# Patient Record
Sex: Female | Born: 2000 | Race: Black or African American | Hispanic: No | Marital: Single | Smoking: Never smoker
Health system: Southern US, Community
[De-identification: ages and names within clinical notes are randomized; demographics above are authoritative.]

---

## 2019-12-24 ENCOUNTER — Emergency Department (HOSPITAL_COMMUNITY): Payer: BC Managed Care – PPO

## 2019-12-24 ENCOUNTER — Emergency Department (HOSPITAL_COMMUNITY)
Admission: EM | Admit: 2019-12-24 | Discharge: 2019-12-24 | Disposition: A | Discharge status: Home / Self Care | Payer: BC Managed Care – PPO | Attending: Emergency Medicine | Admitting: Emergency Medicine

## 2019-12-24 ENCOUNTER — Encounter (HOSPITAL_COMMUNITY): Payer: Self-pay | Admitting: Emergency Medicine

## 2019-12-24 ENCOUNTER — Other Ambulatory Visit: Payer: Self-pay

## 2019-12-24 DIAGNOSIS — R0602 Shortness of breath: Secondary | ICD-10-CM

## 2019-12-24 DIAGNOSIS — U071 COVID-19: Secondary | ICD-10-CM | POA: Diagnosis not present

## 2019-12-24 DIAGNOSIS — R000 Tachycardia, unspecified: Secondary | ICD-10-CM | POA: Diagnosis not present

## 2019-12-24 DIAGNOSIS — R0789 Other chest pain: Secondary | ICD-10-CM | POA: Insufficient documentation

## 2019-12-24 MED ORDER — PREDNISONE 50 MG PO TABS: 50.0000 mg | ORAL_TABLET | Freq: Every day | ORAL | 0 refills | Status: AC

## 2019-12-24 MED ORDER — ALBUTEROL SULFATE HFA 108 (90 BASE) MCG/ACT IN AERS
2.0000 | INHALATION_SPRAY | Freq: Once | RESPIRATORY_TRACT | Status: AC
  Administered 2019-12-24: 2 via RESPIRATORY_TRACT
  Filled 2019-12-24: qty 6.7

## 2019-12-24 NOTE — ED Notes (Signed)
Patient declined repeat COVID-19 testing. MD aware.

## 2019-12-24 NOTE — ED Provider Notes (Signed)
[facility]-EMERGENCY DEPT Provider Note   CSN: [id number] Arrival date & time: 12/24/19  1514     History No chief complaint on file.   Danielle Graves is a 19 y.o. female. She has no significant past medical history. She started with Covid symptoms on November 11 and tested positive on the 16th. She said most of her symptoms are resolved but she still continues to feel short of breath and tight in her chest. No cough no fever. No abdominal pain. She is a non-smoker and does not have any prior pulmonary history. She did not receive any treatment for her Covid infection. She is not vaccinated.  The history is provided by the patient.  Shortness of Breath Severity:  Moderate Onset quality:  Gradual Timing:  Constant Progression:  Unchanged Chronicity:  New Relieved by:  None tried Worsened by:  Nothing Ineffective treatments:  None tried Associated symptoms: chest pain (tightness)   Associated symptoms: no abdominal pain, no cough, no fever, no headaches, no hemoptysis, no rash, no sore throat and no vomiting        History reviewed. No pertinent past medical history.  There are no problems to display for this patient.   History reviewed. No pertinent surgical history.   OB History   No obstetric history on file.     History reviewed. No pertinent family history.  Social History   Tobacco Use  . Smoking status: Never Smoker  . Smokeless tobacco: Never Used  Vaping Use  . Vaping Use: Never used  Substance Use Topics  . Alcohol use: Not Currently  . Drug use: Not Currently    Home Medications Prior to Admission medications   Medication Sig Start Date End Date Taking? Authorizing Provider  predniSONE (DELTASONE) 50 MG tablet Take 1 tablet (50 mg total) by mouth daily. 12/24/19   Terrilee Files, MD    Allergies    Patient has no allergy information on record.  Review of Systems   Review of Systems  Constitutional: Negative for  fever.  HENT: Negative for sore throat.   Eyes: Negative for visual disturbance.  Respiratory: Positive for shortness of breath. Negative for cough and hemoptysis.   Cardiovascular: Positive for chest pain (tightness).  Gastrointestinal: Negative for abdominal pain and vomiting.  Genitourinary: Negative for dysuria.  Musculoskeletal: Negative for back pain.  Skin: Negative for rash.  Neurological: Negative for headaches.    Physical Exam Updated Vital Signs BP 99/79 (BP Location: Right Arm)   Pulse 99   Temp 98.9 F (37.2 C) (Oral)   Resp 18   Ht 5\' 10"  (1.778 m)   Wt 59 kg   LMP 12/11/2019   SpO2 98%   BMI 18.65 kg/m   Physical Exam Vitals and nursing note reviewed.  Constitutional:      General: She is not in acute distress.    Appearance: Normal appearance. She is well-developed.  HENT:     Head: Normocephalic and atraumatic.  Eyes:     Conjunctiva/sclera: Conjunctivae normal.  Cardiovascular:     Rate and Rhythm: Regular rhythm. Tachycardia present.     Pulses: Normal pulses.     Heart sounds: No murmur heard.   Pulmonary:     Effort: Pulmonary effort is normal. No respiratory distress.     Breath sounds: Normal breath sounds.  Abdominal:     Palpations: Abdomen is soft.     Tenderness: There is no abdominal tenderness.  Musculoskeletal:  General: Normal range of motion.     Cervical back: Neck supple.     Right lower leg: No edema.     Left lower leg: No edema.  Skin:    General: Skin is warm and dry.     Capillary Refill: Capillary refill takes less than 2 seconds.  Neurological:     General: No focal deficit present.     Mental Status: She is alert.     ED Results / Procedures / Treatments   Labs (all labs ordered are listed, but only abnormal results are displayed) Labs Reviewed - No data to display  EKG None  Radiology DG Chest [facility] 1 View  Result Date: 12/24/2019 CLINICAL DATA:  Shortness of breath, diagnosed with COVID-19 on  11/18 EXAM: PORTABLE CHEST 1 VIEW COMPARISON:  None. FINDINGS: The heart size and mediastinal contours are within normal limits. Both lungs are clear. No pleural effusion. No pneumothorax. The visualized skeletal structures are unremarkable. IMPRESSION: No acute process in the chest. Electronically Signed   By: Guadlupe Spanish M.D.   On: 12/24/2019 16:26    Procedures Procedures (including critical care time)  Medications Ordered in ED Medications - No data to display  ED Course  I have reviewed the triage vital signs and the nursing notes.  Pertinent labs & imaging results that were available during my care of the patient were reviewed by me and considered in my medical decision making (see chart for details).  Clinical Course as of Dec 25 911  Tue Dec 24, 2019  1532 Patient tested Covid positive on 11/16   [MB]  9191 19 year old female Covid positive here with complaint of chest tightness and shortness of breath over the last few days.  She is in no respiratory distress with a normal respiratory rate and pulse ox 99% on room air.  She is tachycardic.   [MB]  1611 Chest x-ray interpreted by me as no acute pulmonary disease.   [MB]    Clinical Course User Index [MB] Terrilee Files, MD   MDM Rules/Calculators/A&P                         Marikay Roads was evaluated in Emergency Department on 12/24/2019 for the symptoms described in the history of present illness. She was evaluated in the context of the global COVID-19 pandemic, which necessitated consideration that the patient might be at risk for infection with the SARS-CoV-2 virus that causes COVID-19. Institutional protocols and algorithms that pertain to the evaluation of patients at risk for COVID-19 are in a state of rapid change based on information released by regulatory bodies including the CDC and federal and state organizations. These policies and algorithms were followed during the patient's care in the  ED.  Differential diagnosis includes Covid infection, pneumonia, pneumothorax, PE.  Albuterol MDI with some improvement in her shortness of breath.  Do not hear any wheezing but will treat her with inhaler and steroids.  Do not feel she needs CT imaging for PE has appears very comfortable and not having any pain. Final Clinical Impression(s) / ED Diagnoses Final diagnoses:  COVID-19 virus infection  SOB (shortness of breath)    Rx / DC Orders ED Discharge Orders         Ordered    predniSONE (DELTASONE) 50 MG tablet  Daily        12/24/19 1649           Terrilee Files,  MD 12/25/19 4680

## 2019-12-24 NOTE — ED Triage Notes (Signed)
Patient was dx w/ COVID-19 on 11/18, has had chest tightness and SOB x4 days. States she had a cough and fever earlier in her illness, but does not have that currently.

## 2019-12-24 NOTE — Discharge Instructions (Signed)
You are seen in the emergency department for evaluation of continued shortness of breath in the setting of recent Covid diagnosis.  Your oxygen level was good and your chest x-ray did not show any signs of pneumonia.  Please use albuterol 2 puffs every 4 hours as needed for shortness of breath.  Drink plenty of fluids.  Finish steroids.  Follow-up in the post Covid clinic.  Return to the emergency department for any worsening or concerning symptoms.

## 2019-12-24 NOTE — ED Notes (Signed)
Ambulated in room w/ pulse ox, oxygen stayed 98-100% RA.

## 2019-12-25 ENCOUNTER — Encounter (HOSPITAL_COMMUNITY): Payer: Self-pay | Admitting: Emergency Medicine

## 2020-01-01 ENCOUNTER — Telehealth: Payer: Self-pay

## 2020-01-01 NOTE — Telephone Encounter (Signed)
Pt was called per referral from [facility] to make appt w/ [facility]. lvm to return call

## 2020-07-31 ENCOUNTER — Encounter (HOSPITAL_COMMUNITY): Payer: Self-pay

## 2020-07-31 ENCOUNTER — Other Ambulatory Visit: Payer: Self-pay

## 2020-07-31 ENCOUNTER — Emergency Department (HOSPITAL_COMMUNITY)
Admission: EM | Admit: 2020-07-31 | Discharge: 2020-07-31 | Disposition: A | Discharge status: Home / Self Care | Payer: BC Managed Care – PPO | Attending: Emergency Medicine | Admitting: Emergency Medicine

## 2020-07-31 DIAGNOSIS — Z28310 Unvaccinated for covid-19: Secondary | ICD-10-CM | POA: Insufficient documentation

## 2020-07-31 DIAGNOSIS — Z20822 Contact with and (suspected) exposure to covid-19: Secondary | ICD-10-CM

## 2020-07-31 DIAGNOSIS — R0981 Nasal congestion: Secondary | ICD-10-CM | POA: Insufficient documentation

## 2020-07-31 DIAGNOSIS — J029 Acute pharyngitis, unspecified: Secondary | ICD-10-CM

## 2020-07-31 LAB — SARS CORONAVIRUS 2 (TAT 6-24 HRS): SARS Coronavirus 2: NEGATIVE

## 2020-07-31 MED ORDER — ALBUTEROL SULFATE HFA 108 (90 BASE) MCG/ACT IN AERS
1.0000 | INHALATION_SPRAY | Freq: Four times a day (QID) | RESPIRATORY_TRACT | 0 refills | Status: AC | PRN
Start: 2020-07-31 — End: ?

## 2020-07-31 NOTE — Discharge Instructions (Addendum)
We saw in the ER for throat congestion, difficulty breathing.  You had close COVID-19 exposure, therefore we suspect that you likely have it.  Take the medications prescribed as needed. Isolate yourself for at least 5 days and at least 1 more days after your symptoms have resolved before going out in public.

## 2020-07-31 NOTE — ED Triage Notes (Signed)
Pt arrived via POV, c/o mild COVID like sx. States boyfriend tested positive home test last night, she took one last night that was negative.

## 2020-07-31 NOTE — ED Provider Notes (Signed)
[facility]-EMERGENCY DEPT Provider Note   CSN: [id number] Arrival date & time: 07/31/20  6295     History Chief Complaint  Patient presents with   Covid Exposure    Danielle Graves is a 20 y.o. female.  HPI     20 year old female comes in after being exposed to COVID.  She is currently having some throat irritation and congestion in the throat area.  No wheezing, cough, chest pain, fevers, chills.  She does not have asthma or any medical history.  She has not received vaccination against COVID.  She does indicate that her boyfriend is positive from COVID with at home test, her home test was negative.  History reviewed. No pertinent past medical history.  There are no problems to display for this patient.   History reviewed. No pertinent surgical history.   OB History   No obstetric history on file.     History reviewed. No pertinent family history.  Social History   Tobacco Use   Smoking status: Never   Smokeless tobacco: Never  Vaping Use   Vaping Use: Never used  Substance Use Topics   Alcohol use: Not Currently   Drug use: Not Currently    Home Medications Prior to Admission medications   Medication Sig Start Date End Date Taking? Authorizing Provider  albuterol (VENTOLIN HFA) 108 (90 Base) MCG/ACT inhaler Inhale 1-2 puffs into the lungs every 6 (six) hours as needed for wheezing or shortness of breath. 07/31/20  Yes Derwood Kaplan, MD  predniSONE (DELTASONE) 50 MG tablet Take 1 tablet (50 mg total) by mouth daily. 12/24/19   Terrilee Files, MD    Allergies    Patient has no known allergies.  Review of Systems   Review of Systems  Constitutional:  Positive for activity change. Negative for fever.  HENT:  Positive for congestion. Negative for sore throat.   Respiratory:  Negative for cough.   Cardiovascular:  Negative for chest pain.   Physical Exam Updated Vital Signs BP 139/73 (BP Location: Left Arm)   Pulse 86   Temp  98.7 F (37.1 C) (Oral)   Resp 16   SpO2 100%   Physical Exam Vitals and nursing note reviewed.  Constitutional:      Appearance: She is well-developed.  HENT:     Head: Atraumatic.  Cardiovascular:     Rate and Rhythm: Normal rate.  Pulmonary:     Effort: Pulmonary effort is normal.  Musculoskeletal:     Cervical back: Normal range of motion and neck supple.  Skin:    General: Skin is warm and dry.  Neurological:     Mental Status: She is alert and oriented to person, place, and time.    ED Results / Procedures / Treatments   Labs (all labs ordered are listed, but only abnormal results are displayed) Labs Reviewed  SARS CORONAVIRUS 2 (TAT 6-24 HRS)    EKG None  Radiology No results found.  Procedures Procedures   Medications Ordered in ED Medications - No data to display  ED Course  I have reviewed the triage vital signs and the nursing notes.  Pertinent labs & imaging results that were available during my care of the patient were reviewed by me and considered in my medical decision making (see chart for details).    MDM Rules/Calculators/A&P                          20 year old  comes in a chief complaint of COVID-19 exposure.  She has normal hemodynamics and no respiratory distress.  Her medical history is also reassuring.  She is not vaccinated against COVID-19 and her boyfriend tested positive at home with it.  Her home test was negative but she is having some URI-like symptoms.  Will presume she has COVID-19.  She denies any pharyngitis, chest irritation, throat exam is normal-doubt strep.  Final Clinical Impression(s) / ED Diagnoses Final diagnoses:  Close exposure to COVID-19 virus  Pharyngitis, unspecified etiology    Rx / DC Orders ED Discharge Orders          Ordered    albuterol (VENTOLIN HFA) 108 (90 Base) MCG/ACT inhaler  Every 6 hours PRN        07/31/20 1055             Derwood Kaplan, MD 07/31/20 1059

## 2020-08-02 ENCOUNTER — Other Ambulatory Visit: Payer: Self-pay

## 2020-08-02 ENCOUNTER — Emergency Department (HOSPITAL_COMMUNITY): Payer: Medicaid Other

## 2020-08-02 ENCOUNTER — Encounter (HOSPITAL_COMMUNITY): Payer: Self-pay

## 2020-08-02 ENCOUNTER — Emergency Department (HOSPITAL_COMMUNITY)
Admission: EM | Admit: 2020-08-02 | Discharge: 2020-08-02 | Disposition: A | Discharge status: Home / Self Care | Payer: Medicaid Other | Attending: Emergency Medicine | Admitting: Emergency Medicine

## 2020-08-02 DIAGNOSIS — R0789 Other chest pain: Secondary | ICD-10-CM | POA: Insufficient documentation

## 2020-08-02 DIAGNOSIS — R0602 Shortness of breath: Secondary | ICD-10-CM | POA: Insufficient documentation

## 2020-08-02 DIAGNOSIS — R000 Tachycardia, unspecified: Secondary | ICD-10-CM | POA: Insufficient documentation

## 2020-08-02 LAB — COMPREHENSIVE METABOLIC PANEL
ALT: 15 U/L (ref 0–44)
AST: 17 U/L (ref 15–41)
Albumin: 4.5 g/dL (ref 3.5–5.0)
Alkaline Phosphatase: 64 U/L (ref 38–126)
Anion gap: 9 (ref 5–15)
BUN: 13 mg/dL (ref 6–20)
CO2: 21 mmol/L — ABNORMAL LOW (ref 22–32)
Calcium: 9.3 mg/dL (ref 8.9–10.3)
Chloride: 104 mmol/L (ref 98–111)
Creatinine, Ser: 0.9 mg/dL (ref 0.44–1.00)
GFR, Estimated: >60 mL/min (ref >60)
Glucose, Bld: 82 mg/dL (ref 70–99)
Potassium: 3.9 mmol/L (ref 3.5–5.1)
Sodium: 134 mmol/L — ABNORMAL LOW (ref 135–145)
Total Bilirubin: 0.9 mg/dL (ref 0.3–1.2)
Total Protein: 8.2 g/dL — ABNORMAL HIGH (ref 6.5–8.1)

## 2020-08-02 LAB — CBC WITH DIFFERENTIAL/PLATELET
Abs Immature Granulocytes: 0.03 10*3/uL (ref 0.00–0.07)
Basophils Absolute: 0 10*3/uL (ref 0.0–0.1)
Basophils Relative: 0 %
Eosinophils Absolute: 0.1 10*3/uL (ref 0.0–0.5)
Eosinophils Relative: 1 %
HCT: 43.8 % (ref 36.0–46.0)
Hemoglobin: 14.1 g/dL (ref 12.0–15.0)
Immature Granulocytes: 0 %
Lymphocytes Relative: 19 %
Lymphs Abs: 1.6 10*3/uL (ref 0.7–4.0)
MCH: 31.3 pg (ref 26.0–34.0)
MCHC: 32.2 g/dL (ref 30.0–36.0)
MCV: 97.1 fL (ref 80.0–100.0)
Monocytes Absolute: 0.3 10*3/uL (ref 0.1–1.0)
Monocytes Relative: 4 %
Neutro Abs: 6.1 10*3/uL (ref 1.7–7.7)
Neutrophils Relative %: 76 %
Platelets: 260 10*3/uL (ref 150–400)
RBC: 4.51 MIL/uL (ref 3.87–5.11)
RDW: 12.4 % (ref 11.5–15.5)
WBC: 8.1 10*3/uL (ref 4.0–10.5)
nRBC: 0 % (ref 0.0–0.2)

## 2020-08-02 LAB — I-STAT BETA HCG BLOOD, ED (MC, WL, AP ONLY): I-stat hCG, quantitative: <5 m[IU]/mL (ref <5)

## 2020-08-02 LAB — TROPONIN I (HIGH SENSITIVITY): Troponin I (High Sensitivity): <2 ng/L (ref <18)

## 2020-08-02 LAB — BRAIN NATRIURETIC PEPTIDE: B Natriuretic Peptide: 17.6 pg/mL (ref 0.0–100.0)

## 2020-08-02 LAB — D-DIMER, QUANTITATIVE: D-Dimer, Quant: 0.32 ug/mL-FEU (ref 0.00–0.50)

## 2020-08-02 MED ORDER — SODIUM CHLORIDE 0.9 % IV BOLUS
1000.0000 mL | Freq: Once | INTRAVENOUS | Status: AC
  Administered 2020-08-02: 1000 mL via INTRAVENOUS

## 2020-08-02 NOTE — Discharge Instructions (Addendum)
If you develop new or worsening shortness of breath.  Inability to swallow, trouble speaking, coughing up blood, chest pain, or any other new/concerning symptoms or return to the ER for evaluation.

## 2020-08-02 NOTE — ED Triage Notes (Signed)
Patient c/o SOB x 3-4 weeks and worse today.  Patient was seen Friday for the same and Covid test was negative. Patient's significant other had a Covid + home test 3 days ago.

## 2020-08-02 NOTE — ED Provider Notes (Signed)
[facility]-EMERGENCY DEPT Provider Note   CSN: [id number] Arrival date & time: 08/02/20  0903     History Chief Complaint  Patient presents with   Shortness of Breath    Danielle Graves is a 20 y.o. female.  HPI 20 year old female presents with shortness of breath.  This has been ongoing for about 3 or 4 weeks.  It is a continuous sensation throughout the day.  Worse when she does activities but it is overall present at rest.  It feels like she cannot get in a full breath.  Sometimes it feels like is getting caught in her throat.  However she has no sore throat, lip or tongue swelling.  No cough, fever.  Her boyfriend recently was diagnosed with COVID a couple days ago but her symptoms have been going on well before this.  No leg swelling or abdominal pain.  She has occasionally had some chest discomfort.  Recently had a negative COVID PCR 2 days ago.  She was advised to use an albuterol inhaler and she has tried this with no relief.  History reviewed. No pertinent past medical history.  There are no problems to display for this patient.   History reviewed. No pertinent surgical history.   OB History   No obstetric history on file.     Family History  Family history unknown: Yes    Social History   Tobacco Use   Smoking status: Never   Smokeless tobacco: Never  Vaping Use   Vaping Use: Never used  Substance Use Topics   Alcohol use: Not Currently   Drug use: Not Currently    Home Medications Prior to Admission medications   Medication Sig Start Date End Date Taking? Authorizing Provider  albuterol (VENTOLIN HFA) 108 (90 Base) MCG/ACT inhaler Inhale 1-2 puffs into the lungs every 6 (six) hours as needed for wheezing or shortness of breath. 07/31/20   Derwood Kaplan, MD  predniSONE (DELTASONE) 50 MG tablet Take 1 tablet (50 mg total) by mouth daily. 12/24/19   Terrilee Files, MD    Allergies    Patient has no known allergies.  Review of  Systems   Review of Systems  Constitutional:  Negative for fever.  HENT:  Negative for sore throat.   Respiratory:  Positive for shortness of breath. Negative for cough.   Cardiovascular:  Positive for chest pain. Negative for leg swelling.  Gastrointestinal:  Negative for abdominal pain and vomiting.  All other systems reviewed and are negative.  Physical Exam Updated Vital Signs BP 126/84   Pulse 83   Temp 99.4 F (37.4 C) (Oral)   Resp 17   Ht 5\' 11"  (1.803 m)   Wt 49.1 kg   LMP 07/14/2020   SpO2 100%   BMI 15.10 kg/m   Physical Exam Vitals and nursing note reviewed.  Constitutional:      Appearance: She is well-developed.  HENT:     Head: Normocephalic and atraumatic.     Right Ear: External ear normal.     Left Ear: External ear normal.     Nose: Nose normal.     Mouth/Throat:     Pharynx: Oropharynx is clear. No pharyngeal swelling or oropharyngeal exudate.     Comments: Normal voice/phonation Eyes:     General:        Right eye: No discharge.        Left eye: No discharge.  Cardiovascular:     Rate and Rhythm: Regular rhythm.  Tachycardia present.     Heart sounds: Normal heart sounds.     Comments: HR 90s, low 100s Pulmonary:     Effort: Pulmonary effort is normal. No tachypnea, accessory muscle usage or respiratory distress.     Breath sounds: Normal breath sounds. No wheezing, rhonchi or rales.  Abdominal:     Palpations: Abdomen is soft.     Tenderness: There is no abdominal tenderness.  Skin:    General: Skin is warm and dry.  Neurological:     Mental Status: She is alert.  Psychiatric:        Mood and Affect: Mood is not anxious.    ED Results / Procedures / Treatments   Labs (all labs ordered are listed, but only abnormal results are displayed) Labs Reviewed  COMPREHENSIVE METABOLIC PANEL - Abnormal; Notable for the following components:      Result Value   Sodium 134 (*)    CO2 21 (*)    Total Protein 8.2 (*)    All other components  within normal limits  BRAIN NATRIURETIC PEPTIDE  CBC WITH DIFFERENTIAL/PLATELET  D-DIMER, QUANTITATIVE  I-STAT BETA HCG BLOOD, ED ([facility], [facility], [facility] ONLY)  TROPONIN I (HIGH SENSITIVITY)    EKG EKG Interpretation  Date/Time:  Sunday August 02 2020 09:14:35 EDT Ventricular Rate:  107 PR Interval:  114 QRS Duration: 74 QT Interval:  310 QTC Calculation: 413 R Axis:   87 Text Interpretation: Sinus tachycardia Nonspecific T wave abnormality No old tracing to compare Confirmed by Pricilla Loveless [phone]) on 08/02/2020 4:41:59 PM  Radiology DG Chest 2 View  Result Date: 08/02/2020 CLINICAL DATA:  20 year old female with shortness of breath. Positive home test for COVID-19. 3 days ago. EXAM: CHEST - 2 VIEW COMPARISON:  Portable chest 12/24/2019. FINDINGS: Lung volumes are at the upper limits of normal. Normal cardiac size and mediastinal contours. Visualized tracheal air column is within normal limits. Both lungs appear clear. No pneumothorax or pleural effusion. Incidental hair artifact. Mild to moderate thoracolumbar scoliosis. No acute osseous abnormality identified. Negative visible bowel gas. IMPRESSION: 1.  No cardiopulmonary abnormality. 2. Scoliosis. Electronically Signed   By: Odessa Fleming M.D.   On: 08/02/2020 09:53    Procedures Procedures   Medications Ordered in ED Medications  sodium chloride 0.9 % bolus 1,000 mL (1,000 mLs Intravenous New Bag/Given 08/02/20 1748)    ED Course  I have reviewed the triage vital signs and the nursing notes.  Pertinent labs & imaging results that were available during my care of the patient were reviewed by me and considered in my medical decision making (see chart for details).    MDM Rules/Calculators/A&P                          Patient is in no acute distress.  Her labs have been reviewed and are unremarkable.  This includes a negative troponin, as well as a negative D-dimer and otherwise low risk PE.  Was slightly tachycardic on arrival though this is  improving.  Her oropharynx is clear.  I see no obvious reason for why she is short of breath and she is not objectively short of breath here.  She is not hypoxic.  I do not think further ED work-up is needed and she can follow-up with the PCP.  Will give return precautions. Final Clinical Impression(s) / ED Diagnoses Final diagnoses:  Shortness of breath    Rx / DC Orders ED Discharge Orders  None        Pricilla Loveless, MD 08/02/20 [phone]

## 2022-01-05 IMAGING — CR DG CHEST 2V
2 series · 2 of 2 positions shown · non-contrast
Comparison: Portable chest 12/24/2019.

CLINICAL DATA: 19-year-old female with shortness of breath.
Positive home test for [id number]. 3 days ago.

EXAM:
CHEST - 2 VIEW

[w chest pa]
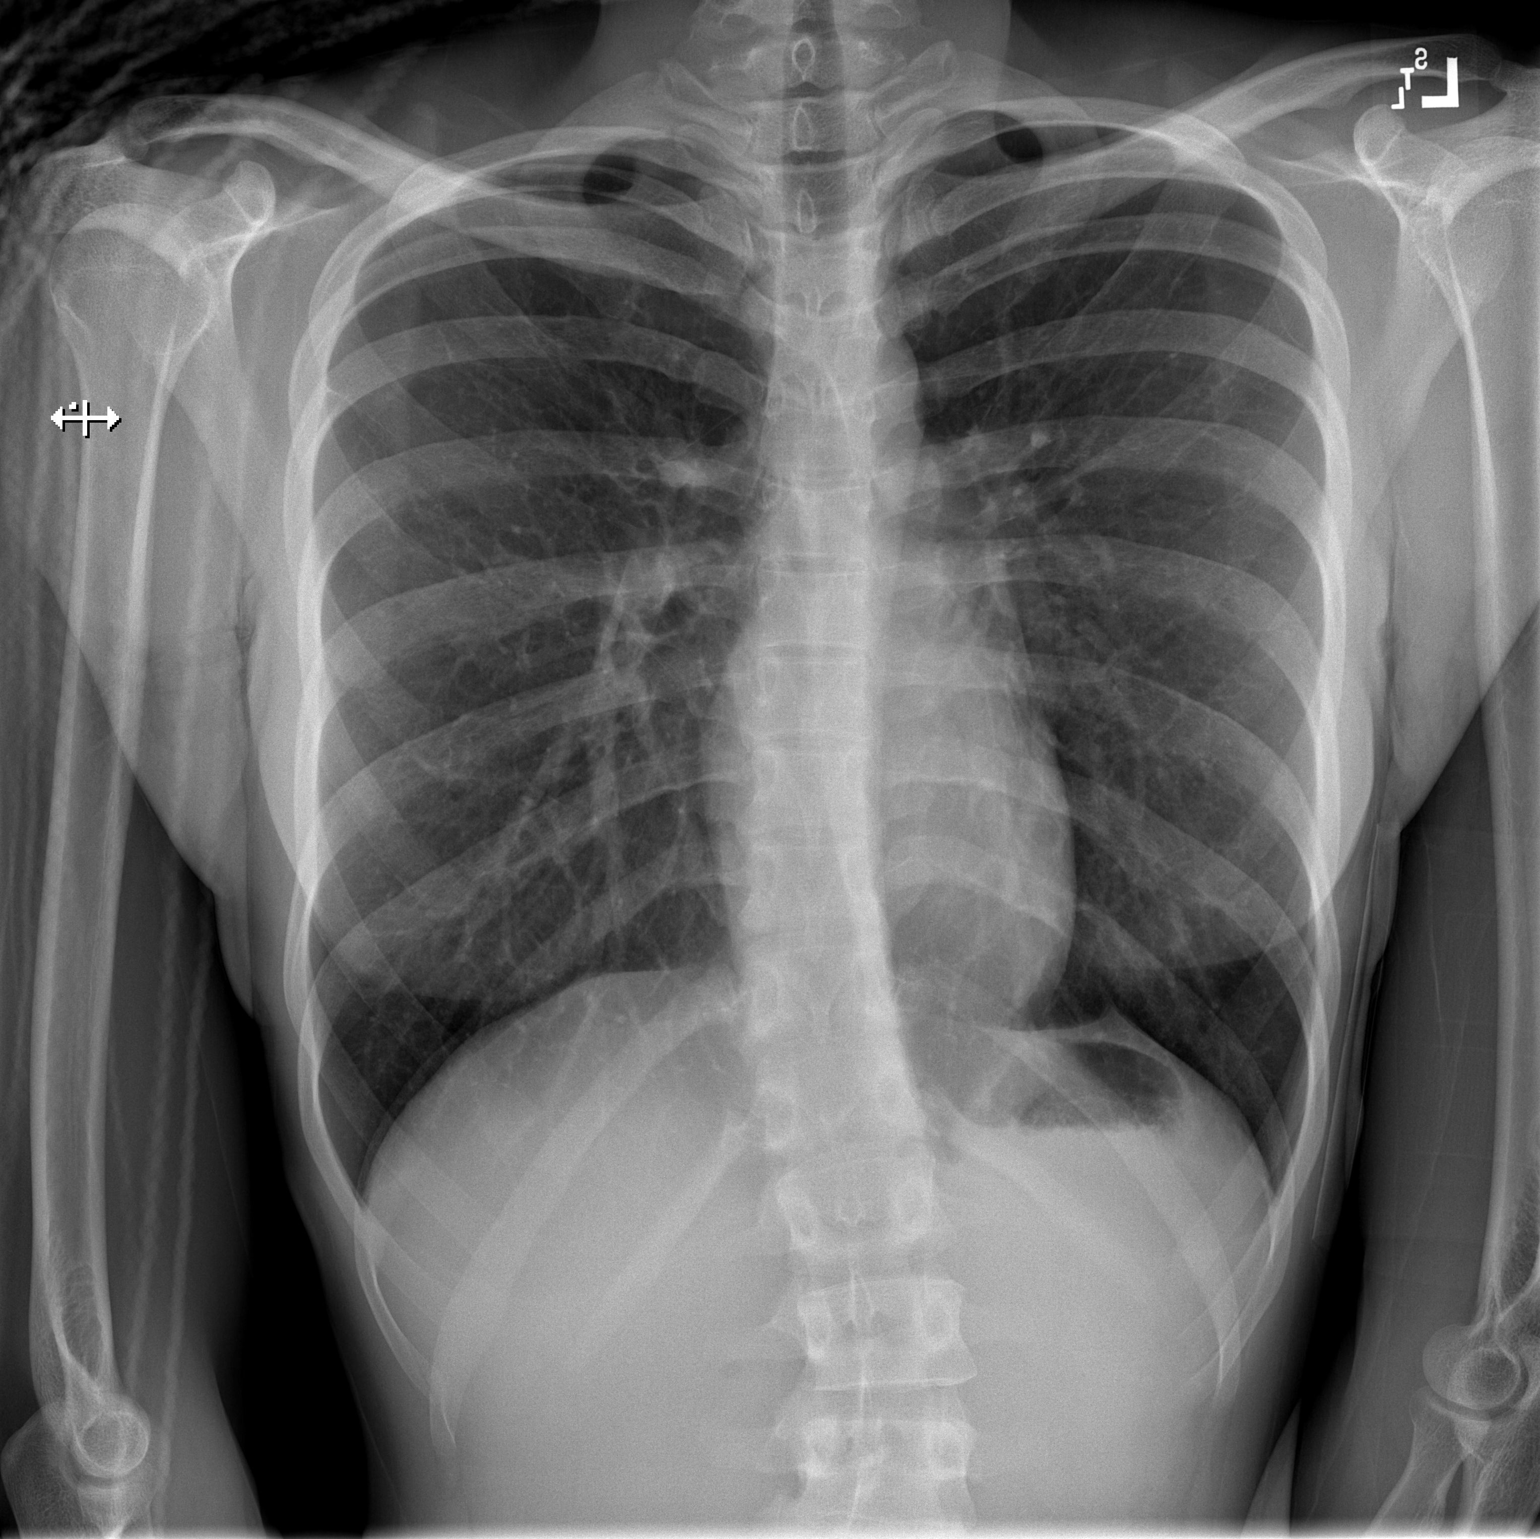

[w chest lat]
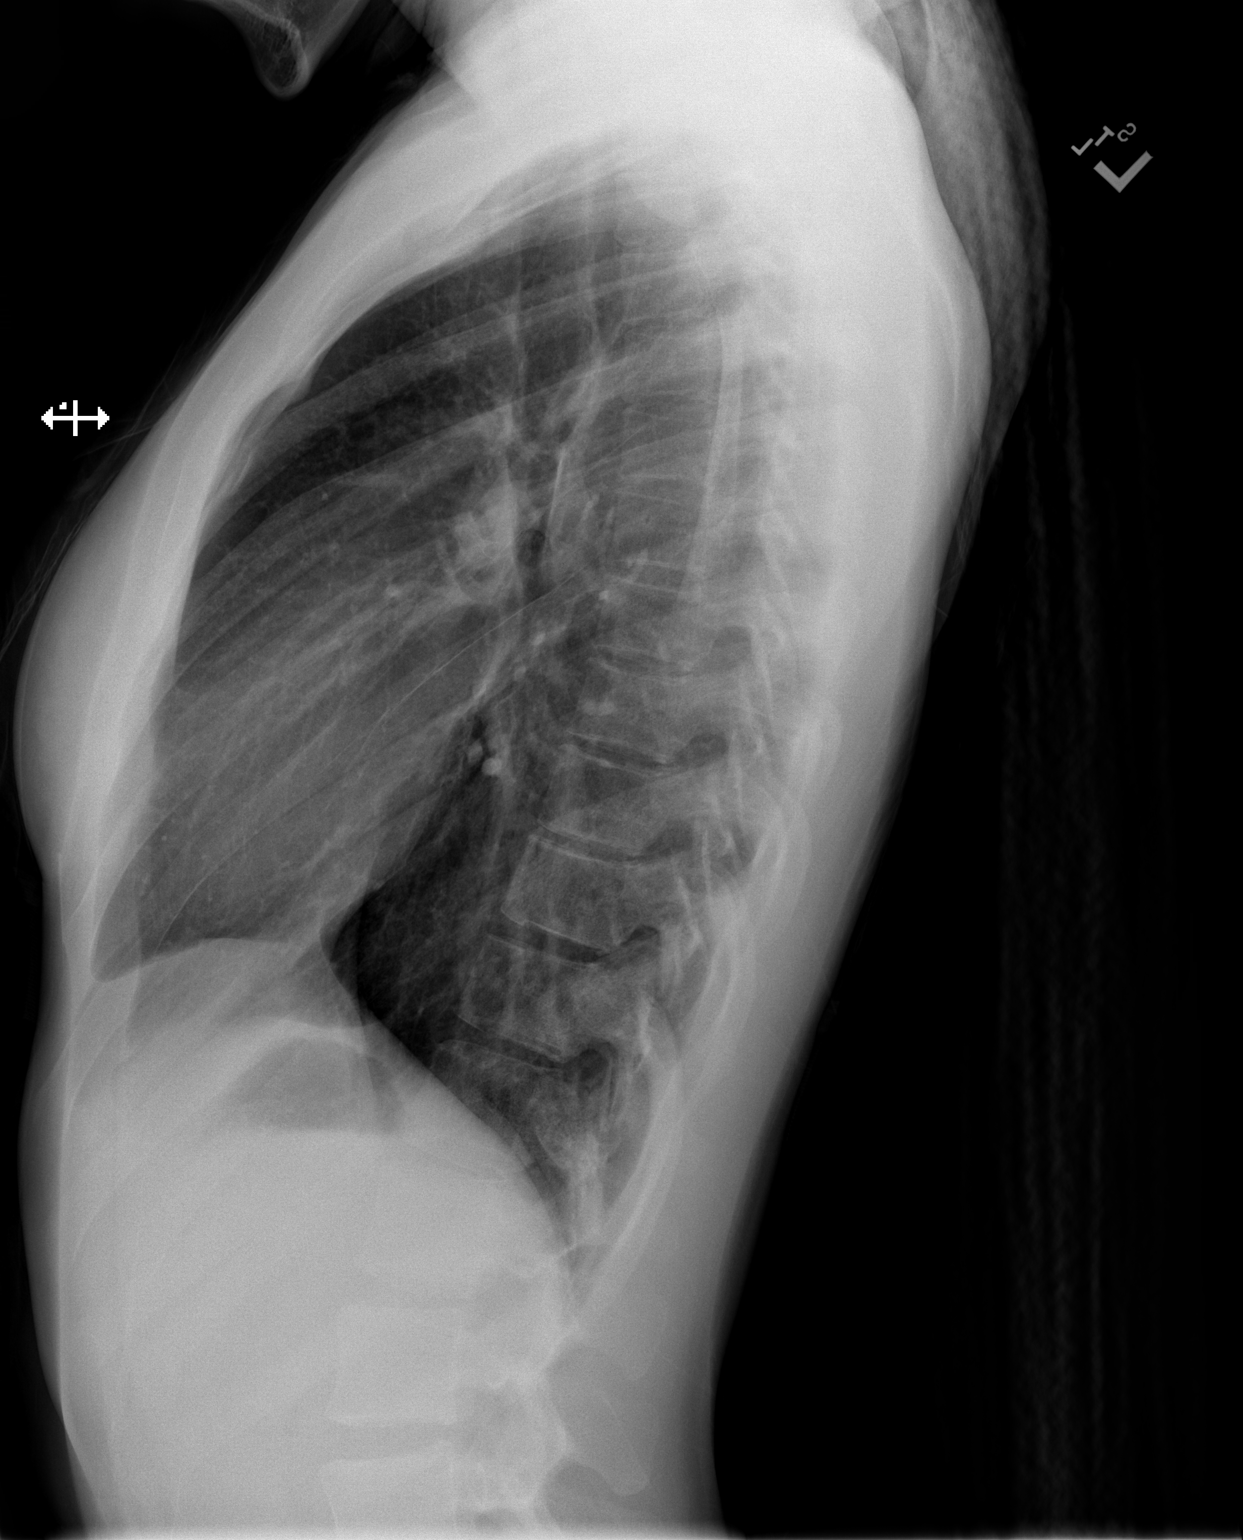

[2 of 2 positions shown; findings below may reference images not displayed]

FINDINGS: Lung volumes are at the upper limits of normal. Normal cardiac size
and mediastinal contours. Visualized tracheal air column is within
normal limits. Both lungs appear clear. No pneumothorax or pleural
effusion. Incidental hair artifact.

Mild to moderate thoracolumbar scoliosis. No acute osseous
abnormality identified. Negative visible bowel gas.
IMPRESSION: 1.  No cardiopulmonary abnormality.
2. Scoliosis.
